# Patient Record
Sex: Male | Born: 1980
Health system: Southern US, Community
[De-identification: ages and names within clinical notes are randomized; demographics above are authoritative.]

## PROBLEM LIST (undated history)

## (undated) DIAGNOSIS — F419 Anxiety disorder, unspecified: Secondary | ICD-10-CM

## (undated) DIAGNOSIS — F4310 Post-traumatic stress disorder, unspecified: Secondary | ICD-10-CM

## (undated) DIAGNOSIS — F32A Depression, unspecified: Secondary | ICD-10-CM

## (undated) DIAGNOSIS — F209 Schizophrenia, unspecified: Secondary | ICD-10-CM

---

## 2017-10-29 ENCOUNTER — Encounter (HOSPITAL_BASED_OUTPATIENT_CLINIC_OR_DEPARTMENT_OTHER): Payer: Self-pay | Admitting: *Deleted

## 2017-10-29 ENCOUNTER — Other Ambulatory Visit: Payer: Self-pay

## 2017-10-29 ENCOUNTER — Emergency Department (HOSPITAL_BASED_OUTPATIENT_CLINIC_OR_DEPARTMENT_OTHER)
Admission: EM | Admit: 2017-10-29 | Discharge: 2017-10-29 | Disposition: A | Discharge status: Home / Self Care | Payer: Self-pay | Attending: Emergency Medicine | Admitting: Emergency Medicine

## 2017-10-29 DIAGNOSIS — F17200 Nicotine dependence, unspecified, uncomplicated: Secondary | ICD-10-CM | POA: Insufficient documentation

## 2017-10-29 DIAGNOSIS — L0231 Cutaneous abscess of buttock: Secondary | ICD-10-CM | POA: Insufficient documentation

## 2017-10-29 MED ORDER — IBUPROFEN 600 MG PO TABS: 600.0000 mg | ORAL_TABLET | Freq: Four times a day (QID) | ORAL | 0 refills | Status: AC | PRN

## 2017-10-29 MED ORDER — LIDOCAINE-EPINEPHRINE (PF) 2 %-1:200000 IJ SOLN
10.0000 mL | Freq: Once | INTRAMUSCULAR | Status: AC
  Administered 2017-10-29: 10 mL
  Filled 2017-10-29 (×2): qty 10

## 2017-10-29 MED ORDER — IBUPROFEN 400 MG PO TABS
600.0000 mg | ORAL_TABLET | Freq: Once | ORAL | Status: AC
  Administered 2017-10-29: 600 mg via ORAL
  Filled 2017-10-29: qty 1

## 2017-10-29 NOTE — ED Triage Notes (Signed)
He has an abscess on his buttocks. States he stuck a razor in it but it only drained a small amount.

## 2017-10-29 NOTE — Discharge Instructions (Signed)
Thank you for allowing me to care for you today in the Emergency Department.   Keep the area clean by washing the incision site once daily with warm water and soap.  You can apply a topical antibiotic such as bacitracin or Neosporin directly to the skin and then place a gauze dressing with tape on the area.  Make sure to change the dressing at least once daily or anytime that it gets dirty or soiled.  Make sure that you keep the area covered with a dressing until it stops draining.  Typically, these will drain for several days after the procedure that you had done today  Pull the packing material out in 48 hours.  There is a little tail and when he took illness, the rest of the material will come out.  Because of where the abscess is, it is important that you do not get any poop in the wound because this can cause a really bad infection.  You can help prevent this by keeping the area covered when you have to poop or have a bowel movement.  Take 600 mg of ibuprofen with food or 650 mg of Tylenol for pain control.  Apply a warm washcloth or compress to the area for 15 to 20 minutes 3 or 4 times a day to help with pain and swelling.  You should return to the emergency department if you develop fever, chills, or if the area gets significantly larger, hot to the touch, or red.

## 2017-10-29 NOTE — ED Notes (Signed)
ED Provider at bedside. 

## 2017-10-29 NOTE — ED Provider Notes (Signed)
[facility] EMERGENCY DEPARTMENT Provider Note   CSN: [id number] Arrival date & time: 10/29/17  1728     History   Chief Complaint Chief Complaint  Patient presents with  . Abscess    HPI Craig Richmond is a 37 y.o. male with no pertinent past medical history who presents to the emergency department with a chief complaint of abscess.  The patient reports a constant, rapidly worsening abscess to the left buttock that began 2 days ago.  He denies fever or chills.  He thinks that the area started after he was bitten by an insect.  He reports that he tried to treated at home by using a sterile razor blade to make an incision to see if it was drained.  He did not get any drainage out of that time.  No history of diabetes mellitus.  He is a current everyday smoker.  The history is provided by the patient. No language interpreter was used.    History reviewed. No pertinent past medical history.  There are no active problems to display for this patient.   History reviewed. No pertinent surgical history.      Home Medications    Prior to Admission medications   Medication Sig Start Date End Date Taking? Authorizing Provider  ibuprofen (ADVIL,MOTRIN) 600 MG tablet Take 1 tablet (600 mg total) by mouth every 6 (six) hours as needed. 10/29/17   Joshau Code A, PA-C    Family History No family history on file.  Social History Social History   Tobacco Use  . Smoking status: Current Every Day Smoker  . Smokeless tobacco: Never Used  Substance Use Topics  . Alcohol use: Not Currently  . Drug use: Not Currently     Allergies   Patient has no known allergies.   Review of Systems Review of Systems  Constitutional: Negative for activity change, chills and fever.  Respiratory: Negative for shortness of breath.   Cardiovascular: Negative for chest pain.  Gastrointestinal: Negative for abdominal pain.  Musculoskeletal: Negative for back pain.  Skin: Positive  for color change and wound. Negative for rash.     Physical Exam Updated Vital Signs BP 130/75 (BP Location: Left Arm)   Pulse 76   Temp 99.1 F (37.3 C) (Oral)   Resp 18   Ht 5' 8.5" (1.74 m)   Wt 95.3 kg   SpO2 99%   BMI 31.47 kg/m   Physical Exam  Constitutional: He appears well-developed.  HENT:  Head: Normocephalic.  Eyes: Conjunctivae are normal.  Neck: Neck supple.  Cardiovascular: Normal rate and regular rhythm.  No murmur heard. Pulmonary/Chest: Effort normal.  Abdominal: Soft. He exhibits no distension.  Neurological: He is alert.  Skin: Skin is warm and dry.  There is a 5 x 5 cm area of induration, fluctuance, erythema, and mild warmth to the left buttock.  No anal involvement.  No red streaking.  Psychiatric: His behavior is normal.  Nursing note and vitals reviewed.    ED Treatments / Results  Labs (all labs ordered are listed, but only abnormal results are displayed) Labs Reviewed  AEROBIC CULTURE (SUPERFICIAL SPECIMEN)    EKG None  Radiology No results found.  Procedures .Marland Kitchen.Incision and Drainage Date/Time: 10/29/2017 8:18 PM Performed by: Barkley BoardsMcDonald, Kayd Launer A, PA-C Authorized by: Barkley BoardsMcDonald, Huston Stonehocker A, PA-C   Consent:    Consent obtained:  Verbal   Consent given by:  Patient   Alternatives discussed:  No treatment Location:    Type:  Abscess  Size:  5x5 cm   Location: left buttock. Pre-procedure details:    Skin preparation:  Betadine Anesthesia (see MAR for exact dosages):    Anesthesia method:  Local infiltration   Local anesthetic:  Lidocaine 2% WITH epi Procedure type:    Complexity:  Simple Procedure details:    Needle aspiration: no     Incision types:  Single straight   Incision depth:  Dermal   Scalpel blade:  10   Wound management:  Probed and deloculated, irrigated with saline and extensive cleaning   Drainage:  Bloody and purulent   Drainage amount:  Moderate   Wound treatment:  Wound left open   Packing materials:  1/2 in  iodoform gauze Post-procedure details:    Patient tolerance of procedure:  Tolerated well, no immediate complications   (including critical care time)  Medications Ordered in ED Medications  lidocaine-EPINEPHrine (XYLOCAINE W/EPI) 2 %-1:200000 (PF) injection 10 mL (has no administration in time range)     Initial Impression / Assessment and Plan / ED Course  I have reviewed the triage vital signs and the nursing notes.  Pertinent labs & imaging results that were available during my care of the patient were reviewed by me and considered in my medical decision making (see chart for details).     Patient with skin abscess amenable to incision and drainage.  A moderate amount of purulent and bloody discharge was expressed.  Superficial wound culture sent.  The wound was packed with half-inch iodoform gauze.  Encouraged home warm soaks and flushing.  Mild signs of cellulitis is surrounding skin.  Will d/c to home.  No antibiotic therapy is indicated.  Return precautions to the ED given given the precarious location of the abscess.  He is hemodynamically stable and otherwise safe for discharge to home.  Final Clinical Impressions(s) / ED Diagnoses   Final diagnoses:  Left buttock abscess    ED Discharge Orders         Ordered    ibuprofen (ADVIL,MOTRIN) 600 MG tablet  Every 6 hours PRN     10/29/17 2014           Kamber Vignola, Coral Else, PA-C 10/29/17 2019    Vanetta Mulders, MD 11/04/17 [phone]

## 2017-11-01 LAB — AEROBIC CULTURE W GRAM STAIN (SUPERFICIAL SPECIMEN)

## 2017-11-01 LAB — AEROBIC CULTURE  (SUPERFICIAL SPECIMEN)

## 2017-11-02 ENCOUNTER — Telehealth: Payer: Self-pay | Admitting: *Deleted

## 2017-11-02 NOTE — Telephone Encounter (Signed)
Post ED Visit - Positive Culture Follow-up: Unsuccessful Patient Follow-up  Culture assessed and recommendations reviewed by:  []  Enzo BiNathan Batchelder, Pharm.D. []  Celedonio MiyamotoJeremy Frens, Pharm.D., BCPS AQ-ID []  Garvin FilaMike Maccia, Pharm.D., BCPS []  Georgina PillionElizabeth Martin, [address]Pharm.D., BCPS []  Allison ParkMinh Pham, Vermont[place]arm.D., BCPS, AAHIVP []  Estella HuskMichelle Turner, Pharm.D., BCPS, AAHIVP []  Sherlynn CarbonAustin Lucas, PharmD []  Pollyann SamplesAndy Johnston, PharmD, BCPS Jairo Benachel Rumberger, PharmD  Positive wound culture, reviewed by Harlene SaltsBrandon Morelli, PA-C  [x]  Patient discharged without antimicrobial prescription and treatment is now indicated []  Organism is resistant to prescribed ED discharge antimicrobial []  Patient with positive blood cultures  Plan: Doxycycline 100mg  PO BID x 7 days  Unable to contact patient after 3 attempts, letter will be sent to address on file  Lysle PearlRobertson, Bibiana Gillean Talley 11/02/2017, 10:27 AM

## 2017-11-02 NOTE — Progress Notes (Signed)
ED Antimicrobial Stewardship Positive Culture Follow Up   [name] is an 37 y.o. male who [facility] with a chief complaint of  Chief Complaint  Patient presents with  . Abscess    Recent Results (from the past 720 hour(s))  Wound or Superficial Culture     Status: None   Collection Time: 10/29/17  8:14 PM  Result Value Ref Range Status   Specimen Description   Final    BUTTOCKS Performed at [facility], [address] [place], [place] [address]    Special Requests   Final    Immunocompromised Performed at [facility], [address] [place], [place] [address]    Gram Stain   Final    FEW WBC PRESENT, PREDOMINANTLY PMN RARE GRAM POSITIVE COCCI Performed at [facility], [address] [place], [place] [address]    Culture   Final    MODERATE METHICILLIN RESISTANT STAPHYLOCOCCUS AUREUS   Report Status 11/01/2017 FINAL  Final   Organism ID, Bacteria METHICILLIN RESISTANT STAPHYLOCOCCUS AUREUS  Final      Susceptibility   Methicillin resistant staphylococcus aureus - MIC*    CIPROFLOXACIN >=8 RESISTANT Resistant     ERYTHROMYCIN >=8 RESISTANT Resistant     GENTAMICIN <=0.5 SENSITIVE Sensitive     OXACILLIN >=4 RESISTANT Resistant     TETRACYCLINE <=1 SENSITIVE Sensitive     VANCOMYCIN <=0.5 SENSITIVE Sensitive     TRIMETH/SULFA >=320 RESISTANT Resistant     CLINDAMYCIN >=8 RESISTANT Resistant     RIFAMPIN <=0.5 SENSITIVE Sensitive     Inducible Clindamycin NEGATIVE Sensitive     * MODERATE METHICILLIN RESISTANT STAPHYLOCOCCUS AUREUS    []  Treated with N/A, organism resistant to prescribed antimicrobial [x]  Patient discharged originally without antimicrobial agent and treatment is now indicated  New antibiotic prescription: doxycycline 100mg  PO BID x 7 days  ED Provider: Harlene SaltsBrandon Morelli, PA   Tibor Lemmons, Drake LeachRachel Lynn 11/02/2017, 9:26 AM Clinical Pharmacist Monday - Friday phone -  [phone] Saturday  - Sunday phone - [phone]

## 2018-01-07 ENCOUNTER — Telehealth: Payer: Self-pay | Admitting: Emergency Medicine

## 2018-01-07 NOTE — Telephone Encounter (Signed)
Lost to followup 

## 2019-01-20 ENCOUNTER — Emergency Department (HOSPITAL_COMMUNITY)
Admission: EM | Admit: 2019-01-20 | Discharge: 2019-01-20 | Disposition: A | Discharge status: Home / Self Care | Payer: Worker's Compensation | Attending: Emergency Medicine | Admitting: Emergency Medicine

## 2019-01-20 ENCOUNTER — Other Ambulatory Visit: Payer: Self-pay

## 2019-01-20 ENCOUNTER — Emergency Department (HOSPITAL_COMMUNITY): Payer: Self-pay | Attending: Emergency Medicine

## 2019-01-20 ENCOUNTER — Encounter (HOSPITAL_COMMUNITY): Payer: Self-pay | Admitting: Emergency Medicine

## 2019-01-20 DIAGNOSIS — Y990 Civilian activity done for income or pay: Secondary | ICD-10-CM | POA: Diagnosis not present

## 2019-01-20 DIAGNOSIS — S99911A Unspecified injury of right ankle, initial encounter: Secondary | ICD-10-CM | POA: Diagnosis present

## 2019-01-20 DIAGNOSIS — W228XXA Striking against or struck by other objects, initial encounter: Secondary | ICD-10-CM | POA: Diagnosis not present

## 2019-01-20 DIAGNOSIS — Y9389 Activity, other specified: Secondary | ICD-10-CM | POA: Diagnosis not present

## 2019-01-20 DIAGNOSIS — Y9289 Other specified places as the place of occurrence of the external cause: Secondary | ICD-10-CM | POA: Diagnosis not present

## 2019-01-20 DIAGNOSIS — F17200 Nicotine dependence, unspecified, uncomplicated: Secondary | ICD-10-CM | POA: Insufficient documentation

## 2019-01-20 DIAGNOSIS — R202 Paresthesia of skin: Secondary | ICD-10-CM | POA: Diagnosis not present

## 2019-01-20 DIAGNOSIS — S9001XA Contusion of right ankle, initial encounter: Secondary | ICD-10-CM | POA: Diagnosis not present

## 2019-01-20 MED ORDER — IBUPROFEN 400 MG PO TABS
600.0000 mg | ORAL_TABLET | Freq: Once | ORAL | Status: AC
  Administered 2019-01-20: 600 mg via ORAL
  Filled 2019-01-20: qty 1

## 2019-01-20 NOTE — ED Notes (Signed)
Patient transported to X-ray 

## 2019-01-20 NOTE — Discharge Instructions (Signed)
Use ice, ibuprofen, Tylenol for pain. Gradually return to full weightbearing. Follow-up with your work Librarian, academic for further management. Return for focal weakness, no improvement in 1 week or other concerns.

## 2019-01-20 NOTE — ED Notes (Signed)
Patient awake alert, color pink,chest clear,good aeration,no retractions 3plus pulses<2sec refill,patient ambulatory after tolerating po med
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
## 2019-07-10 ENCOUNTER — Other Ambulatory Visit: Payer: Self-pay

## 2019-07-10 ENCOUNTER — Encounter: Payer: Self-pay | Admitting: Internal Medicine

## 2019-07-10 ENCOUNTER — Ambulatory Visit (INDEPENDENT_AMBULATORY_CARE_PROVIDER_SITE_OTHER): Payer: Self-pay | Admitting: Internal Medicine

## 2019-07-10 DIAGNOSIS — F319 Bipolar disorder, unspecified: Secondary | ICD-10-CM | POA: Insufficient documentation

## 2019-07-10 DIAGNOSIS — R531 Weakness: Secondary | ICD-10-CM

## 2019-07-10 DIAGNOSIS — Z139 Encounter for screening, unspecified: Secondary | ICD-10-CM

## 2019-07-10 DIAGNOSIS — I10 Essential (primary) hypertension: Secondary | ICD-10-CM

## 2019-07-10 DIAGNOSIS — F1190 Opioid use, unspecified, uncomplicated: Secondary | ICD-10-CM

## 2019-07-10 DIAGNOSIS — F3132 Bipolar disorder, current episode depressed, moderate: Secondary | ICD-10-CM

## 2019-07-10 DIAGNOSIS — F1199 Opioid use, unspecified with unspecified opioid-induced disorder: Secondary | ICD-10-CM

## 2019-07-10 DIAGNOSIS — F329 Major depressive disorder, single episode, unspecified: Secondary | ICD-10-CM

## 2019-07-10 DIAGNOSIS — Z7689 Persons encountering health services in other specified circumstances: Secondary | ICD-10-CM
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
## 2019-07-10 NOTE — Patient Instructions (Addendum)
Mr. Stites,  It was a pleasure seeing you in clinic. Today we discussed:   Right sided weakness/numbness: I will obtain records from [facility] regarding your care with them. You may need further testing and evaluation for this.  Hypertension: I will continue to monitor you off of BP medications. We will get labs at your next visit.   Bipolar disorder: Please continue to take your medications as prescribed. Please follow up with [facility].   You will need to make an appointment with the financial counselor for your orange card. I will also send a referral to case management to assist with housing situation.    Please contact us if you have any questions or concerns.  Thank you!

## 2019-07-21 DIAGNOSIS — R531 Weakness: Secondary | ICD-10-CM | POA: Insufficient documentation

## 2019-07-21 NOTE — Assessment & Plan Note (Signed)
Patient with current cocaine (3.5g/day) and heroin use (1g/day). He also endorses taking oxycodone "as he can get it". He has a 30 month old son and notes that he is motivated to improve his life for his son.   Plan - Referral to OUD clinic  - Referral to CCM for resources

## 2019-07-21 NOTE — Assessment & Plan Note (Signed)
Patient reports being diagnosed with hypertension around the age of 71 while he was incarcerated and was started on antihypertensive at the time. He notes that he was taking this up until a few years ago.  Blood pressure 139/90 at this visit.  He does endorse ongoing cocaine use which could be contributing to his hypertension.  Patient does endorse motivation to discontinue cocaine use.  Patient would benefit from further work-up including renal ultrasound, renal and aldosterone levels, secondary causes of hypertension.  However, due to his uninsured status unable to obtain these at this time.   Plan -Encouraged cocaine cessation -BP check in 3 months -Consider starting low-dose HCTZ

## 2019-07-21 NOTE — Progress Notes (Signed)
Internal Medicine Clinic Attending  Case discussed with Dr. Aslam at the time of the visit.  We reviewed the resident's history and exam and pertinent patient test results.  I agree with the assessment, diagnosis, and plan of care documented in the resident's note.  

## 2019-07-21 NOTE — Assessment & Plan Note (Signed)
Patient has a history of recurrent incarcerations since the age of 16. He is currently unhomed and residing in a hotel with his significant other and 50-month-old spouse. He is currently uninsured as well. Patient notes ongoing cocaine and heroine use as listed below and does endorse willingness to quit. Suspect his cocaine use is also contributing to his hypertension as listed above. Patient would benefit from optimization of social determinants of health.   Plan: Referral to CCM

## 2019-07-21 NOTE — Assessment & Plan Note (Addendum)
Patient notes that he has a history of bipolar disorder.  He follows at [facility] for his medications.  He is on Depakote 500 mg daily and Zyprexa 10 mg daily.  He notes taking 1 other medication; however, is unable to recall the name at this time.  No recent manic episodes. However, PHQ-9 score 19 at this visit. Patient notes history of suicidal ideation but no attempts. No current suicidal ideation.   Plan Continue antipsychotics Follow-up with [facility]
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
## 2019-07-22 ENCOUNTER — Telehealth: Payer: Self-pay | Admitting: *Deleted

## 2019-07-22 NOTE — Telephone Encounter (Signed)
-----   Message from Tyson Alias, MD sent at 07/22/2019  1:29 PM EDT ----- Regarding: FW: OUD clinic Can we reach out to this patient to schedule him for OUD at next available please?  ----- Message ----- From: Gust Rung, DO Sent: 07/22/2019  11:31 AM EDT To: Tyson Alias, MD, Eliezer Bottom, MD Subject: RE: OUD clinic                                 Leanna Sato, we certainly can see about having him seen to start suboxone, it sounds like he likely will qualify.  Drs Felton Clinton, and Guilloud also see OUD patients and Dr Oswaldo Done is OUD attending next week. So I will add him to this tread.  We do not have a formal referral process, however if you think you have a patient for the OUD clinic, presenting them to one of the four of [place] if we are in clinic that day is great, otherwise you can send a group message to [place] by messaging P IMP OUD ATTENDING. -Dr Mikey Bussing ----- Message ----- From: Eliezer Bottom, MD Sent: 07/21/2019  11:36 AM EDT To: Gust Rung, DO Subject: OUD clinic                                     Good morning Dr. Mikey Bussing,  This patient would like to establish care with the opioid use clinic. He is currently using cocaine and heroine and also takes oxycodone frequently (usually off the streets). He does endorse willingness to change for his son. Would he be a good candidate for the opioid use clinic? If so, how can I place the referral?   Thank you, Leanna Sato

## 2019-07-22 NOTE — Telephone Encounter (Signed)
Pt scheduled for OUD Clinic 06/01/201 @ 9:45am (pt aware).Craig Spittle Cassady5/18/20211:52 PM

## 2019-07-23 ENCOUNTER — Ambulatory Visit: Payer: Self-pay | Admitting: *Deleted

## 2019-07-23 ENCOUNTER — Ambulatory Visit: Payer: Self-pay

## 2019-07-23 DIAGNOSIS — F1190 Opioid use, unspecified, uncomplicated: Secondary | ICD-10-CM

## 2019-07-23 DIAGNOSIS — I10 Essential (primary) hypertension: Secondary | ICD-10-CM

## 2019-07-23 DIAGNOSIS — F3132 Bipolar disorder, current episode depressed, moderate: Secondary | ICD-10-CM

## 2019-07-23 NOTE — Chronic Care Management (AMB) (Signed)
Chronic Care Management    Clinical Social Work General Note  07/23/2019 Name: Craig Richmond MRN: [id number] DOB: 21-Jun-1980  Craig Richmond is a 39 y.o. year old male who is a primary care patient of Marianna Payment, MD. The CCM was consulted to assist the patient with [employer] .   Mr. Mathews was given information about Chronic Care Management services today including:  1. CCM service includes personalized support from designated clinical staff supervised by his physician, including individualized plan of care and coordination with other care providers 2. 24/7 contact phone numbers for assistance for urgent and routine care needs. 3. Service will only be billed when office clinical staff spend 20 minutes or more in a month to coordinate care. 4. Only one practitioner may furnish and bill the service in a calendar month. 5. The patient may stop CCM services at any time (effective at the end of the month) by phone call to the office staff. 6. The patient will be responsible for cost sharing (co-pay) of up to 20% of the service fee (after annual deductible is met).  Patient agreed to services and verbal consent obtained.   Review of patient status, including review of consultants reports, relevant laboratory and other test results, and collaboration with appropriate care team members and the patient's provider was performed as part of comprehensive patient evaluation and provision of chronic care management services.    SDOH (Social Determinants of Health) assessments and interventions performed:  Yes SDOH Interventions     Most Recent Value  SDOH Interventions  SDOH Interventions for the Following Domains  Financial Strain, Food Insecurity, Housing  Food Insecurity Interventions  Assist with [employer] Application  Financial Strain Interventions  Other (Comment) [Referral to Disability Assistance Program and to Care Guide for assistance with Medicaid application]  Housing  Interventions  Other (Comment) [[address], [facility]]       Outpatient Encounter Medications as of 07/23/2019  Medication Sig  . divalproex (DEPAKOTE ER) 500 MG 24 hr tablet Take 500 mg by mouth daily.  Craig Richmond ibuprofen (ADVIL,MOTRIN) 600 MG tablet Take 1 tablet (600 mg total) by mouth every 6 (six) hours as needed.  Craig Richmond OLANZapine (ZYPREXA) 10 MG tablet Take 10 mg by mouth at bedtime.   No facility-administered encounter medications on file as of 07/23/2019.    Goals Addressed            This Visit's Progress   . "I don't have anywhere to live" (pt-stated)       Current Barriers:  Craig Richmond Knowledge Barriers related to resources available to address housing barriers.  Patient previously living in a motel with significant other and son but states today that this situation has changed and he has nowhere to live.  Patient was not forthcoming with specific details about this situation.   Case Manager Clinical Goal(s):  Craig Richmond Over the next 180 days, patient will work with BSW to address needs related to Housing barriers . Over the next 180 days, BSW will collaborate with RN Care Manager to address care management and care coordination needs  Interventions:  . Patient interviewed and appropriate assessments performed . Offered to assist patient with locating emergency shelter.  Patient declined this offer stating "I'm good with that." . Provided patient with number for [facility] and encouraged him to call to be assessed for housing program.  . Talked with patient about Craig [place] [address] and encouraged patient to call regarding housing assistance . Collaborated with RN Care  Manager and patient to establish an individualized plan of care   Patient Self Care Activities:  . Patient plans to pursue assistance through resources provided.   Initial goal documentation     . "I have no income right now" (pt-stated)       Current Barriers:  Craig Richmond Knowledge Barriers related to resources  available for financial constraints.  Patient currently unemployed with no source of income.  Has applied for disability but was denied; wishes to apply again.    Case Manager Clinical Goal(s):  Craig Richmond Over the next 180 days, patient will work with BSW to address needs related to financial constraints  . Over the next 180 days, BSW will collaborate with RN Care Manager to address care management and care coordination needs  Interventions:  . Patient interviewed and appropriate assessments performed . Collaborated with RN Care Manager and patient to establish an individualized plan of care  . Collaborated with care guide team regarding assistance with Medicaid and [place] SNAP applications . Informed patient about The Disability Assistance Program through [facility].  Patient provided address for consent form to be mailed to.  Consent form is necessary for referral to be submitted to program. . Reminded patient about upcoming clinic appointments including appointment with financial counselor on 08/11/19.    Patient Self Care Activities:  . Patient verbalizes understanding of plan to work with care guide for Medicaid and  SNAP applications.   . Patient verbalized understanding of plan to sign and return consent form for Disability Assistance Program.   Initial goal documentation         Follow Up Plan: Appointment scheduled for SW follow up with client by phone on: 07/31/19.  Patient provided with contact information for CCM SW and encouraged to call before next outreach if needed.          Ronn Melena, [place] Coordination Social Worker [place] [phone]

## 2019-07-23 NOTE — Progress Notes (Signed)
Internal Medicine Clinic Attending  CCM services provided by the care management provider and their documentation were discussed with Dr. Aslam. We reviewed the pertinent findings, urgent action items addressed by the resident and non-urgent items to be addressed by the PCP.  I agree with the assessment, diagnosis, and plan of care documented in the CCM and resident's note.  Shardea Cwynar, MD 07/23/2019 

## 2019-07-23 NOTE — Chronic Care Management (AMB) (Addendum)
Care Management   Initial Visit Note  07/23/2019 Name: Craig Richmond MRN: [id number] DOB: 02-26-1981     Objective:  BP Readings from Last 3 Encounters:  07/10/19 139/90  01/20/19 135/86  10/29/17 130/75   Wt Readings from Last 3 Encounters:  07/10/19 230 lb 3.2 oz (104.4 kg)  10/29/17 210 lb (95.3 kg)    Assessment: Craig Richmond is a 39 y.o. year old male who sees Dellia Cloud, MD for primary care. The care management team was consulted for assistance with care management and care coordination needs related to Care Coordination Substance Abuse Counseling Other homelessness and lack of income.   Review of patient status, including review of consultants reports, relevant laboratory and other test results, and collaboration with appropriate care team members and the patient's provider was performed as part of comprehensive patient evaluation and provision of care management services.    SDOH (Social Determinants of Health) assessments performed: No See Care Plan activities for detailed interventions related to [facility])     Outpatient Encounter Medications as of 07/23/2019  Medication Sig  . divalproex (DEPAKOTE ER) 500 MG 24 hr tablet Take 500 mg by mouth daily.  Marland Kitchen ibuprofen (ADVIL,MOTRIN) 600 MG tablet Take 1 tablet (600 mg total) by mouth every 6 (six) hours as needed.  Marland Kitchen OLANZapine (ZYPREXA) 10 MG tablet Take 10 mg by mouth at bedtime.   No facility-administered encounter medications on file as of 07/23/2019.    Goals Addressed            This Visit's Progress     Patient Stated   . "I told the social worker I don't have any place to live and no income." (pt-stated)       CARE PLAN ENTRY (see longitudinal plan of care for additional care plan information)   Current Barriers:  . Chronic Disease Management support, education, and care coordination needs related to HTN, Bipolar Disorder, and Homelessness  Case Manager Clinical Goal(s):  Marland Kitchen Over the next 90  days, patient will work with BSW to address needs related to Financial constraints related to no income . Limited access to food . Housing barriers . Substance abuse issues -  cocaine and heroin in patient with HTN, Bipolar Disorder, and Homelessness  Interventions:  . Collaborated with BSW to initiate plan of care to address needs related to Financial constraints related to no income . Limited access to food . Housing barriers . Substance abuse issues -  cocaine and heroin  in patient with HTN, Bipolar Disorder, and Homelessness  Patient Self Care Activities:  . Patient verbalizes understanding of plan to work with BSW and community care guide to address social barriers related to financial strain, economic strain, substance abuse and homelessness . Self administers medications as prescribed . Attends all scheduled provider appointments . Calls pharmacy for medication refills . Performs ADL's independently . Performs IADL's independently . Calls provider office for new concerns or questions  Initial goal documentation         Follow up plan:  The care management team will reach out to the patient again over the next 30 days.   [name] was given information about Care Management services today including:  1. Care Management services include personalized support from designated clinical staff supervised by a physician, including individualized plan of care and coordination with other care providers 2. 24/7 contact phone numbers for assistance for urgent and routine care needs. 3. The patient may stop Care Management services at any  time (effective at the end of the month) by phone call to the office staff.  Patient agreed to services and verbal consent obtained.  No CCM RN needs identified at this time.  Kelli Churn RN, CCM, [facility] RN Care Manager [phone]

## 2019-07-23 NOTE — Patient Instructions (Signed)
Visit Information  Goals Addressed            This Visit's Progress     Patient Stated   . "I told the social worker I don't have any place to live and no income." (pt-stated)       CARE PLAN ENTRY (see longitudinal plan of care for additional care plan information)   Current Barriers:  . Chronic Disease Management support, education, and care coordination needs related to HTN, Bipolar Disorder, and Homelessness  Case Manager Clinical Goal(s):  Marland Kitchen Over the next 90 days, patient will work with BSW to address needs related to Financial constraints related to no income . Limited access to food . Housing barriers . Substance abuse issues -  cocaine and heroin in patient with HTN, Bipolar Disorder, and Homelessness  Interventions:  . Collaborated with BSW to initiate plan of care to address needs related to Financial constraints related to no income . Limited access to food . Housing barriers . Substance abuse issues -  cocaine and heroin  in patient with HTN, Bipolar Disorder, and Homelessness  Patient Self Care Activities:  . Patient verbalizes understanding of plan to work with BSW and community care guide to address social barriers related to financial strain, economic strain, substance abuse and homelessness . Self administers medications as prescribed . Attends all scheduled provider appointments . Calls pharmacy for medication refills . Performs ADL's independently . Performs IADL's independently . Calls provider office for new concerns or questions  Initial goal documentation        Craig Richmond was given information about Care Management services today including:  1. Care Management services include personalized support from designated clinical staff supervised by his physician, including individualized plan of care and coordination with other care providers 2. 24/7 contact phone numbers for assistance for urgent and routine care needs. 3. The patient may stop CCM  services at any time (effective at the end of the month) by phone call to the office staff.  Patient agreed to services and verbal consent obtained.   The patient verbalized understanding of instructions provided today and declined a print copy of patient instruction materials.   The care management team will reach out to the patient again over the next 30 days.   Cranford Mon RN, CCM, [facility] CCM Clinic RN Care Manager [phone]

## 2019-07-23 NOTE — Progress Notes (Signed)
Internal Medicine Clinic Resident  I have personally reviewed this encounter including the documentation in this note and/or discussed this patient with the care management provider. I will address any urgent items identified by the care management provider and will communicate my actions to the patient's PCP. I have reviewed the patient's CCM visit with my supervising attending, Dr Butcher.  Seymour Pavlak, MD  Internal Medicine, PGY-1 07/23/2019    

## 2019-07-23 NOTE — Patient Instructions (Signed)
Visit Information  Goals Addressed            This Visit's Progress   . "I don't have anywhere to live" (pt-stated)       Current Barriers:  Marland Kitchen Knowledge Barriers related to resources available to address housing barriers.  Patient previously living in a motel with significant other and son but states today that this situation has changed and he has nowhere to live.  Patient was not forthcoming with specific details about this situation.   Case Manager Clinical Goal(s):  Marland Kitchen Over the next 180 days, patient will work with BSW to address needs related to Housing barriers . Over the next 180 days, BSW will collaborate with RN Care Manager to address care management and care coordination needs  Interventions:  . Patient interviewed and appropriate assessments performed . Offered to assist patient with locating emergency shelter.  Patient declined this offer stating "I'm good with that." . Provided patient with number for [facility] and encouraged him to call to be assessed for housing program.  . Talked with patient about [place] [address] and encouraged patient to call regarding housing assistance . Collaborated with RN Care Manager and patient to establish an individualized plan of care   Patient Self Care Activities:  . Patient plans to pursue assistance through resources provided.   Initial goal documentation     . "I have no income right now" (pt-stated)       Current Barriers:  Marland Kitchen Knowledge Barriers related to resources available for financial constraints.  Patient currently unemployed with no source of income.  Has applied for disability but was denied; wishes to apply again.    Case Manager Clinical Goal(s):  Marland Kitchen Over the next 180 days, patient will work with BSW to address needs related to financial constraints  . Over the next 180 days, BSW will collaborate with RN Care Manager to address care management and care coordination needs  Interventions:  . Patient interviewed  and appropriate assessments performed . Collaborated with RN Care Manager and patient to establish an individualized plan of care  . Collaborated with care guide team regarding assistance with Medicaid and [place] SNAP applications . Informed patient about The Disability Assistance Program through [facility].  Patient provided address for consent form to be mailed to.  Consent form is necessary for referral to be submitted to program. . Reminded patient about upcoming clinic appointments including appointment with financial counselor on 08/11/19.    Patient Self Care Activities:  . Patient verbalizes understanding of plan to work with care guide for Medicaid and [place] SNAP applications.   . Patient verbalized understanding of plan to sign and return consent form for Disability Assistance Program.   Initial goal documentation        Patient verbalizes understanding of instructions provided today.   Follow Up Plan: Appointment scheduled for SW follow up with client by phone on: 07/31/19.  Patient provided with contact information for CCM SW and encouraged to call before next outreach if needed.     Malachy Chamber, BSW Embedded Care Coordination Social Worker [facility] [phone]

## 2019-07-24 NOTE — Progress Notes (Signed)
Internal Medicine Clinic Attending  CCM services provided by the care management provider and their documentation were discussed with Dr. Aslam. We reviewed the pertinent findings, urgent action items addressed by the resident and non-urgent items to be addressed by the PCP.  I agree with the assessment, diagnosis, and plan of care documented in the CCM and resident's note.  Christo Hain Thomas Levette Paulick, MD 07/24/2019  

## 2019-07-24 NOTE — Progress Notes (Addendum)
Internal Medicine Clinic Resident  I have personally reviewed this encounter including the documentation in this note and/or discussed this patient with the care management provider. I will address any urgent items identified by the care management provider and will communicate my actions to the patient's PCP. I have reviewed the patient's CCM visit with my supervising attending, Dr Vincent.  Sadia Aslam, MD  Internal Medicine, PGY-1 07/24/2019    

## 2019-07-31 ENCOUNTER — Telehealth: Payer: Self-pay

## 2019-08-05 ENCOUNTER — Telehealth: Payer: Self-pay

## 2019-08-07 ENCOUNTER — Telehealth: Payer: Self-pay | Admitting: Internal Medicine

## 2019-08-07 ENCOUNTER — Telehealth: Payer: Self-pay

## 2019-08-07 NOTE — Telephone Encounter (Signed)
  Community Resource Referral   KNB 08/07/2019  DOB: 07-17-1980   AGE: 39 y.o.   GENDER: male   PCP Dellia Cloud, MD.   Called pt regarding Community Resource Referral pt asked for a c/b in a few minutes. Agreed to connect with [facility] DSS to apply for Medicaid and SNAP benefits. Manuela Schwartz  Care Guide . Embedded Care Coordination [facility] Management [email or web address]  [phone]

## 2019-08-11 ENCOUNTER — Ambulatory Visit: Payer: Self-pay | Admitting: Internal Medicine

## 2019-08-11 ENCOUNTER — Ambulatory Visit: Payer: Self-pay

## 2019-08-11 VITALS — BP 140/87 | HR 72 | Temp 98.4°F | Ht 68.0 in | Wt 233.3 lb

## 2019-08-11 DIAGNOSIS — I10 Essential (primary) hypertension: Secondary | ICD-10-CM

## 2019-08-11 DIAGNOSIS — R531 Weakness: Secondary | ICD-10-CM

## 2019-08-11 MED ORDER — AMLODIPINE BESYLATE 5 MG PO TABS: 2.5000 mg | ORAL_TABLET | Freq: Every day | ORAL | 0 refills | Status: AC

## 2019-08-11 NOTE — Telephone Encounter (Signed)
  Community Resource Referral   KNB 08/11/2019  2nd Attempt - vm box not set up  DOB: 1980/12/19   AGE: 39 y.o.   GENDER: male   PCP Dellia Cloud, MD.   Called pt regarding Community Resource Referral Follow up on: 08/12/2019 Manuela Schwartz  Care Guide . Embedded Care Coordination [facility] Management [email or web address]  [phone]

## 2019-08-11 NOTE — Patient Instructions (Addendum)
It was our pleasure taking care of you in our clinic today.  Start you on a blood pressure medication. (Amlodipine 2.5 mg daily) please pick it up from pharmacy and take it as instructed.  Please take rest of your medications as before.  Please bring your document to start the process of getting insurance (orange card) coverage as a scheduled at 08/13/2019 with our financial counselor.  Follow-up with your primary care in 1-2 months to establish care. We will need to have your medical record from your prior clinic.  Please bring it with you next time. You have your insurance coverage, we will do some blood work and probably imaging for your right arm and foot pain that you have been deal with since your injury several month ago.  Should you have any questions or concerns please call the internal medicine clinic at [phone].

## 2019-08-11 NOTE — Progress Notes (Signed)
promed   CC: Follow-up of HTN and right side pain and numbness  HPI:  [name] is a 39 y.o. male with PMHx as documented below, presented for follow-up of right upper and lower extremity pain and hypertension.  Please refer to problem based charting for further details and assessment and plan of current problem and chronic medical conditions.   PMHx: HTN, Bipolar disorder, OUD, rt sided weakness 2/2 to injury  Review of Systems:  Review of Systems  Constitutional: Negative for chills and fever.  Gastrointestinal: Negative for nausea and vomiting.  Genitourinary:       No urinary incontinency.  Neurological: Positive for sensory change and weakness.   Physical Exam:  Vitals:   08/11/19 1349  BP: 140/87  Pulse: 72  Temp: 98.4 F (36.9 C)  TempSrc: Oral  SpO2: 98%  Weight: 233 lb 4.8 oz (105.8 kg)  Height: 5\' 8"  (1.727 m)   Constitutional: Well-developed and well-nourished. No acute distress.  HENT:  Head: Normocephalic and atraumatic.  Eyes: Conjunctivae are normal, EOM nl Cardiovascular: RRR, nl S1S2, no murmur, no LEE Respiratory: Effort normal and breath sounds normal. No respiratory distress. No wheezes.  GI: Soft. Bowel sounds are normal. No distension. There is no tenderness.  Neurological: Is alert and oriented x 3, rt upper and lower extremity with decreased sensation and weakness 3-4/5. Skin: Not diaphoretic. No erythema.  Psychiatric:  Normal mood and affect. Behavior is normal. Judgment and thought content normal.   Assessment & Plan:   See Encounters Tab for problem based charting.  Patient discussed with Dr. 

## 2019-08-12 NOTE — Assessment & Plan Note (Signed)
Patient has had consistent residual weakness/decreased sensation of rt upper and lower extremities after had injury at work on 01/2019. This is a chronic problem. Unclear how much work up performed but per patient, no MRI was done by [employer] comp.  -Med rec release request signed by pt to be faxed to [facility] -When gets the orange card, may consider EMG/NCV vs spinal MRI and neuro/neurosurgery referral if needed -Continue PT -F/u with financial counselor for orange card (pt has appointment in 2 days)

## 2019-08-12 NOTE — Assessment & Plan Note (Signed)
BP today is 140/87. The patient's blood pressure during this visit was: BP Readings from Last 3 Encounters:  08/11/19 140/87  07/10/19 139/90  01/20/19 135/86   -He is interested in starting medications. He mentions that he was on antihypertensive medications before and when incarcerated. -Started Amlodipine 2.5 mg QD (No insurance coverage and wont need frequent BMP check on Amlodipine) -Counseled about importance of stopping Cocaine use

## 2019-08-12 NOTE — Telephone Encounter (Signed)
  Community Resource Referral   KNB 08/12/2019  1st Attempt  DOB: 1980-11-28   AGE: 39 y.o.   GENDER: male   PCP Dellia Cloud, MD.   Called pt regarding Community Resource Referral - pt stated that he had applied for Medicaid in the past but wasn't certain of the date. He discussed his appt tomorrow with financial counselor on 6/9. 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 Asked him if he could speak with DSS to apply for Medicaid as that would be the fastest approach and 3-wayd the call to apply for Medicaid and for Food Stamps.  Manuela Schwartz  Care Guide . Embedded Care Coordination [facility] Management [email or web address]  [phone]

## 2019-08-13 ENCOUNTER — Ambulatory Visit: Payer: Self-pay

## 2019-08-18 NOTE — Progress Notes (Signed)
Internal Medicine Clinic Attending  Case discussed with Dr. Masoudi  at the time of the visit.  We reviewed the resident's history and exam and pertinent patient test results.  I agree with the assessment, diagnosis, and plan of care documented in the resident's note.  

## 2019-08-22 ENCOUNTER — Ambulatory Visit: Payer: Self-pay

## 2019-08-22 ENCOUNTER — Telehealth: Payer: Self-pay

## 2019-08-22 NOTE — Chronic Care Management (AMB) (Signed)
  Chronic Care Management   Outreach Note  08/22/2019 Name: Craig Richmond MRN: [id number] DOB: 1980-09-12  Referred by: Dellia Cloud, MD Reason for referral : Care Coordination [facility])   An unsuccessful telephone outreach was attempted today. The patient was referred to the case management team for assistance with care management and care coordination. Unable to leave message due to mailbox not being set up.    Follow Up Plan: Will try to reach again next week.        Malachy Chamber, BSW Embedded Care Coordination Social Worker [facility] [phone]

## 2019-08-29 ENCOUNTER — Ambulatory Visit: Payer: Self-pay

## 2019-08-29 ENCOUNTER — Telehealth: Payer: Self-pay

## 2019-08-29 NOTE — Chronic Care Management (AMB) (Signed)
  Chronic Care Management   Outreach Note  08/29/2019 Name: Craig Richmond MRN: [id number] DOB: 08/24/1980  Referred by: Dellia Cloud, MD Reason for referral : Care Coordination [facility])   A second unsuccessful telephone outreach was attempted today. The patient was referred to the case management team for assistance with care management and care coordination. Mailbox not set up yet.    Follow Up Plan: Will try to reach again next week.       Malachy Chamber, BSW Embedded Care Coordination Social Worker [facility] [phone]

## 2019-09-04 ENCOUNTER — Telehealth: Payer: Self-pay

## 2019-09-04 ENCOUNTER — Ambulatory Visit: Payer: Self-pay

## 2019-09-04 NOTE — Chronic Care Management (AMB) (Signed)
  Chronic Care Management   Outreach Note  09/04/2019 Name: Craig Richmond MRN: [id number] DOB: 1980-10-12  Referred by: Dellia Cloud, MD Reason for referral : Care Coordination [facility])   Third unsuccessful telephone outreach was attempted today. Unable to leave message due to mailbox not being set up. The patient was referred to the case management team for assistance with care management and care coordination. The patient's primary care provider has been notified of our unsuccessful attempts to make or maintain contact with the patient. The care management team is pleased to engage with this patient at any time in the future should he/she be interested in assistance from the care management team.   Follow Up Plan: The care management team is available to follow up with the patient after provider conversation with the patient regarding recommendation for care management engagement and subsequent re-referral to the care management team.       Malachy Chamber, BSW Embedded Care Coordination Social Worker [facility] [phone]

## 2019-11-19 MED FILL — AMLODIPINE BESYLATE 5 MG TA: 5 | 30 days supply | Qty: 15 | Fill #1

## 2019-12-09 ENCOUNTER — Ambulatory Visit: Payer: Self-pay

## 2019-12-09 NOTE — Chronic Care Management (AMB) (Signed)
CCM status updated to previously enrolled due to inability to maintain contact with pt.      Malachy Chamber, BSW Embedded Care Coordination Social Worker [facility] [phone]

## 2020-01-27 ENCOUNTER — Emergency Department (HOSPITAL_COMMUNITY)
Admission: EM | Admit: 2020-01-27 | Discharge: 2020-01-27 | Disposition: A | Discharge status: Home / Self Care | Payer: Self-pay | Attending: Emergency Medicine | Admitting: Emergency Medicine

## 2020-01-27 ENCOUNTER — Other Ambulatory Visit: Payer: Self-pay

## 2020-01-27 ENCOUNTER — Encounter (HOSPITAL_COMMUNITY): Payer: Self-pay

## 2020-01-27 DIAGNOSIS — Z87891 Personal history of nicotine dependence: Secondary | ICD-10-CM | POA: Insufficient documentation

## 2020-01-27 DIAGNOSIS — Z79899 Other long term (current) drug therapy: Secondary | ICD-10-CM | POA: Insufficient documentation

## 2020-01-27 DIAGNOSIS — Z09 Encounter for follow-up examination after completed treatment for conditions other than malignant neoplasm: Secondary | ICD-10-CM

## 2020-01-27 DIAGNOSIS — R441 Visual hallucinations: Secondary | ICD-10-CM | POA: Insufficient documentation

## 2020-01-27 DIAGNOSIS — Z046 Encounter for general psychiatric examination, requested by authority: Secondary | ICD-10-CM | POA: Insufficient documentation

## 2020-01-27 DIAGNOSIS — R440 Auditory hallucinations: Secondary | ICD-10-CM | POA: Insufficient documentation

## 2020-01-27 DIAGNOSIS — I10 Essential (primary) hypertension: Secondary | ICD-10-CM | POA: Insufficient documentation

## 2020-01-27 DIAGNOSIS — Z8659 Personal history of other mental and behavioral disorders: Secondary | ICD-10-CM

## 2020-01-27 HISTORY — DX: Post-traumatic stress disorder, unspecified: F43.10

## 2020-01-27 HISTORY — DX: Anxiety disorder, unspecified: F41.9

## 2020-01-27 HISTORY — DX: Depression, unspecified: F32.A

## 2020-01-27 HISTORY — DX: Schizophrenia, unspecified: F20.9

## 2020-01-27 LAB — CBC
HCT: 45.1 % (ref 39.0–52.0)
Hemoglobin: 14.5 g/dL (ref 13.0–17.0)
MCH: 26.7 pg (ref 26.0–34.0)
MCHC: 32.2 g/dL (ref 30.0–36.0)
MCV: 82.9 fL (ref 80.0–100.0)
Platelets: 240 10*3/uL (ref 150–400)
RBC: 5.44 MIL/uL (ref 4.22–5.81)
RDW: 13.1 % (ref 11.5–15.5)
WBC: 3.6 10*3/uL — ABNORMAL LOW (ref 4.0–10.5)
nRBC: 0 % (ref 0.0–0.2)

## 2020-01-27 LAB — SALICYLATE LEVEL: Salicylate Lvl: <7 mg/dL — ABNORMAL LOW (ref 7.0–30.0)

## 2020-01-27 LAB — ACETAMINOPHEN LEVEL: Acetaminophen (Tylenol), Serum: <10 ug/mL — ABNORMAL LOW (ref 10–30)

## 2020-01-27 LAB — COMPREHENSIVE METABOLIC PANEL
ALT: 26 U/L (ref 0–44)
AST: 25 U/L (ref 15–41)
Albumin: 4.1 g/dL (ref 3.5–5.0)
Alkaline Phosphatase: 82 U/L (ref 38–126)
Anion gap: 10 (ref 5–15)
BUN: 10 mg/dL (ref 6–20)
CO2: 25 mmol/L (ref 22–32)
Calcium: 8.9 mg/dL (ref 8.9–10.3)
Chloride: 104 mmol/L (ref 98–111)
Creatinine, Ser: 1.14 mg/dL (ref 0.61–1.24)
GFR, Estimated: >60 mL/min (ref >60)
Glucose, Bld: 104 mg/dL — ABNORMAL HIGH (ref 70–99)
Potassium: 4 mmol/L (ref 3.5–5.1)
Sodium: 139 mmol/L (ref 135–145)
Total Bilirubin: 0.7 mg/dL (ref 0.3–1.2)
Total Protein: 7.5 g/dL (ref 6.5–8.1)

## 2020-01-27 LAB — ETHANOL: Alcohol, Ethyl (B): <10 mg/dL (ref <10)

## 2020-01-27 NOTE — ED Provider Notes (Signed)
[facility] EMERGENCY DEPARTMENT Provider Note   CSN: [id number] Arrival date & time: 01/27/20  1049     History Chief Complaint  Patient presents with  . Psychiatric Evaluation    Craig Richmond is a 39 y.o. male with a past medical history of schizophrenia, PTSD, anxiety and depression presenting to the ED requesting mental health evaluation.  States that he has been off of his psych he had tried medications for several months.  He used to follow-up at [facility] but has not done so.  He does not remember the name of his medications.  Reports auditory and visual hallucinations at times.  Denies any SI, HI or other complaints.  HPI     Past Medical History:  Diagnosis Date  . Anxiety   . Depression   . PTSD (post-traumatic stress disorder)   . Schizophrenia [facility])     Patient Active Problem List   Diagnosis Date Noted  . Right sided weakness 07/21/2019  . Hypertension 07/10/2019  . Bipolar disorder (HCC) 07/10/2019  . Encounter for screening involving social determinants of health (SDoH) 07/10/2019  . Opioid use disorder 07/10/2019    History reviewed. No pertinent surgical history.     No family history on file.  Social History   Tobacco Use  . Smoking status: Former Smoker    Quit date: 03/07/2010    Years since quitting: 9.8  . Smokeless tobacco: Never Used  . Tobacco comment: stopped in 2012   Substance Use Topics  . Alcohol use: Not Currently  . Drug use: Not Currently    Home Medications Prior to Admission medications   Medication Sig Start Date End Date Taking? Authorizing Provider  amLODipine (NORVASC) 5 MG tablet Take 0.5 tablets (2.5 mg total) by mouth daily. 08/11/19   Masoudi, Shawna Orleans, MD  divalproex (DEPAKOTE ER) 500 MG 24 hr tablet Take 500 mg by mouth daily.    [provider]  ibuprofen (ADVIL,MOTRIN) 600 MG tablet Take 1 tablet (600 mg total) by mouth every 6 (six) hours as needed. 10/29/17   McDonald, Mia A,  PA-C  OLANZapine (ZYPREXA) 10 MG tablet Take 10 mg by mouth at bedtime.    [provider]    Allergies    Bactrim [sulfamethoxazole-trimethoprim]  Review of Systems   Review of Systems  Constitutional: Negative for appetite change, chills and fever.  HENT: Negative for ear pain, rhinorrhea, sneezing and sore throat.   Eyes: Negative for photophobia and visual disturbance.  Respiratory: Negative for cough, chest tightness, shortness of breath and wheezing.   Cardiovascular: Negative for chest pain and palpitations.  Gastrointestinal: Negative for abdominal pain, blood in stool, constipation, diarrhea, nausea and vomiting.  Genitourinary: Negative for dysuria, hematuria and urgency.  Musculoskeletal: Negative for myalgias.  Skin: Negative for rash.  Neurological: Negative for dizziness, weakness and light-headedness.  Psychiatric/Behavioral: Positive for hallucinations.    Physical Exam Updated Vital Signs BP (!) 148/102 (BP Location: Right Arm)   Pulse 67   Temp 98.2 F (36.8 C) (Oral)   Resp 20   Ht 5\' 8"  (1.727 m)   Wt 97.5 kg   SpO2 98%   BMI 32.69 kg/m   Physical Exam Vitals and nursing note reviewed.  Constitutional:      General: He is not in acute distress.    Appearance: He is well-developed.  HENT:     Head: Normocephalic and atraumatic.     Nose: Nose normal.  Eyes:     General: No  scleral icterus.       Left eye: No discharge.     Conjunctiva/sclera: Conjunctivae normal.  Cardiovascular:     Rate and Rhythm: Normal rate and regular rhythm.     Heart sounds: Normal heart sounds. No murmur heard.  No friction rub. No gallop.   Pulmonary:     Effort: Pulmonary effort is normal. No respiratory distress.     Breath sounds: Normal breath sounds.  Abdominal:     General: Bowel sounds are normal. There is no distension.     Palpations: Abdomen is soft.     Tenderness: There is no abdominal tenderness. There is no guarding.  Musculoskeletal:         General: Normal range of motion.     Cervical back: Normal range of motion and neck supple.  Skin:    General: Skin is warm and dry.     Findings: No rash.  Neurological:     Mental Status: He is alert.     Motor: No abnormal muscle tone.     Coordination: Coordination normal.     ED Results / Procedures / Treatments   Labs (all labs ordered are listed, but only abnormal results are displayed) Labs Reviewed  COMPREHENSIVE METABOLIC PANEL - Abnormal; Notable for the following components:      Result Value   Glucose, Bld 104 (*)    All other components within normal limits  SALICYLATE LEVEL - Abnormal; Notable for the following components:   Salicylate Lvl <7.0 (*)    All other components within normal limits  ACETAMINOPHEN LEVEL - Abnormal; Notable for the following components:   Acetaminophen (Tylenol), Serum <10 (*)    All other components within normal limits  CBC - Abnormal; Notable for the following components:   WBC 3.6 (*)    All other components within normal limits  ETHANOL  RAPID URINE DRUG SCREEN, HOSP PERFORMED    EKG None  Radiology No results found.  Procedures Procedures (including critical care time)  Medications Ordered in ED Medications - No data to display  ED Course  I have reviewed the triage vital signs and the nursing notes.  Pertinent labs & imaging results that were available during my care of the patient were reviewed by me and considered in my medical decision making (see chart for details).    MDM Rules/Calculators/A&P                          39 year old male presenting to the ED requesting mental health evaluation.  Was on psychiatric medications in the past but has not taken them in several months and would like to get back on them.  He reports auditory and visual hallucinations that the medications prior improved.  He denies any SI, HI.  His thought content is normal, he appears stable here.  He is not hallucinating on my  evaluation.  He has no plans to harm himself or anyone else.  He is hemodynamically stable.  I spoke to [facility] provider [place] money regarding follow up at [facility].  Patient states that he is a resident of [facility] and I have had registration correct this in the system.  He was told to follow-up at the Center tomorrow morning as a walk-in.  He is stable here and is comfortable with this plan.  His medical screening lab work is unremarkable.  He is medically cleared for follow up there.   Patient is hemodynamically stable, in  NAD, and able to ambulate in the ED. Evaluation does not show pathology that would require ongoing emergent intervention or inpatient treatment. I explained the diagnosis to the patient. Pain has been managed and has no complaints prior to discharge. Patient is comfortable with above plan and is stable for discharge at this time. All questions were answered prior to disposition. Strict return precautions for returning to the ED were discussed. Encouraged follow up with PCP.   An After Visit Summary was printed and given to the patient.   Portions of this note were generated with Scientist, clinical (histocompatibility and immunogenetics). Dictation errors may occur despite best attempts at proofreading.  Final Clinical Impression(s) / ED Diagnoses Final diagnoses:  Psychiatric follow-up    Rx / DC Orders ED Discharge Orders    None       Dietrich Pates, PA-C 01/27/20 1311    Arby Barrette, MD 01/27/20 1343

## 2020-01-27 NOTE — Discharge Instructions (Addendum)
You can follow-up at the clinic listed below tomorrow morning  [facility] [address] Walk-in appointment time Monday - Thursday 8-11 am, except this Thursday bc of Thanksgiving

## 2020-01-27 NOTE — ED Triage Notes (Signed)
Pt requesting mental health evaluation. Hx of psych problems but has been out of his meds for a while. Denies SI/HI at this time, does report AH and VH. Pt calm and cooperative in triage.

## 2020-01-27 NOTE — ED Notes (Signed)
Pt refused last set of VS. Says he wants everything to discarded and that he better not see a bill.

## 2021-02-02 IMAGING — CR DG ANKLE COMPLETE 3+V*R*
3 series · 3 of 3 positions shown · non-contrast
Comparison: None.

CLINICAL DATA: Posttraumatic right ankle pain

EXAM:
RIGHT ANKLE - COMPLETE 3+ VIEW

[ankle ap]
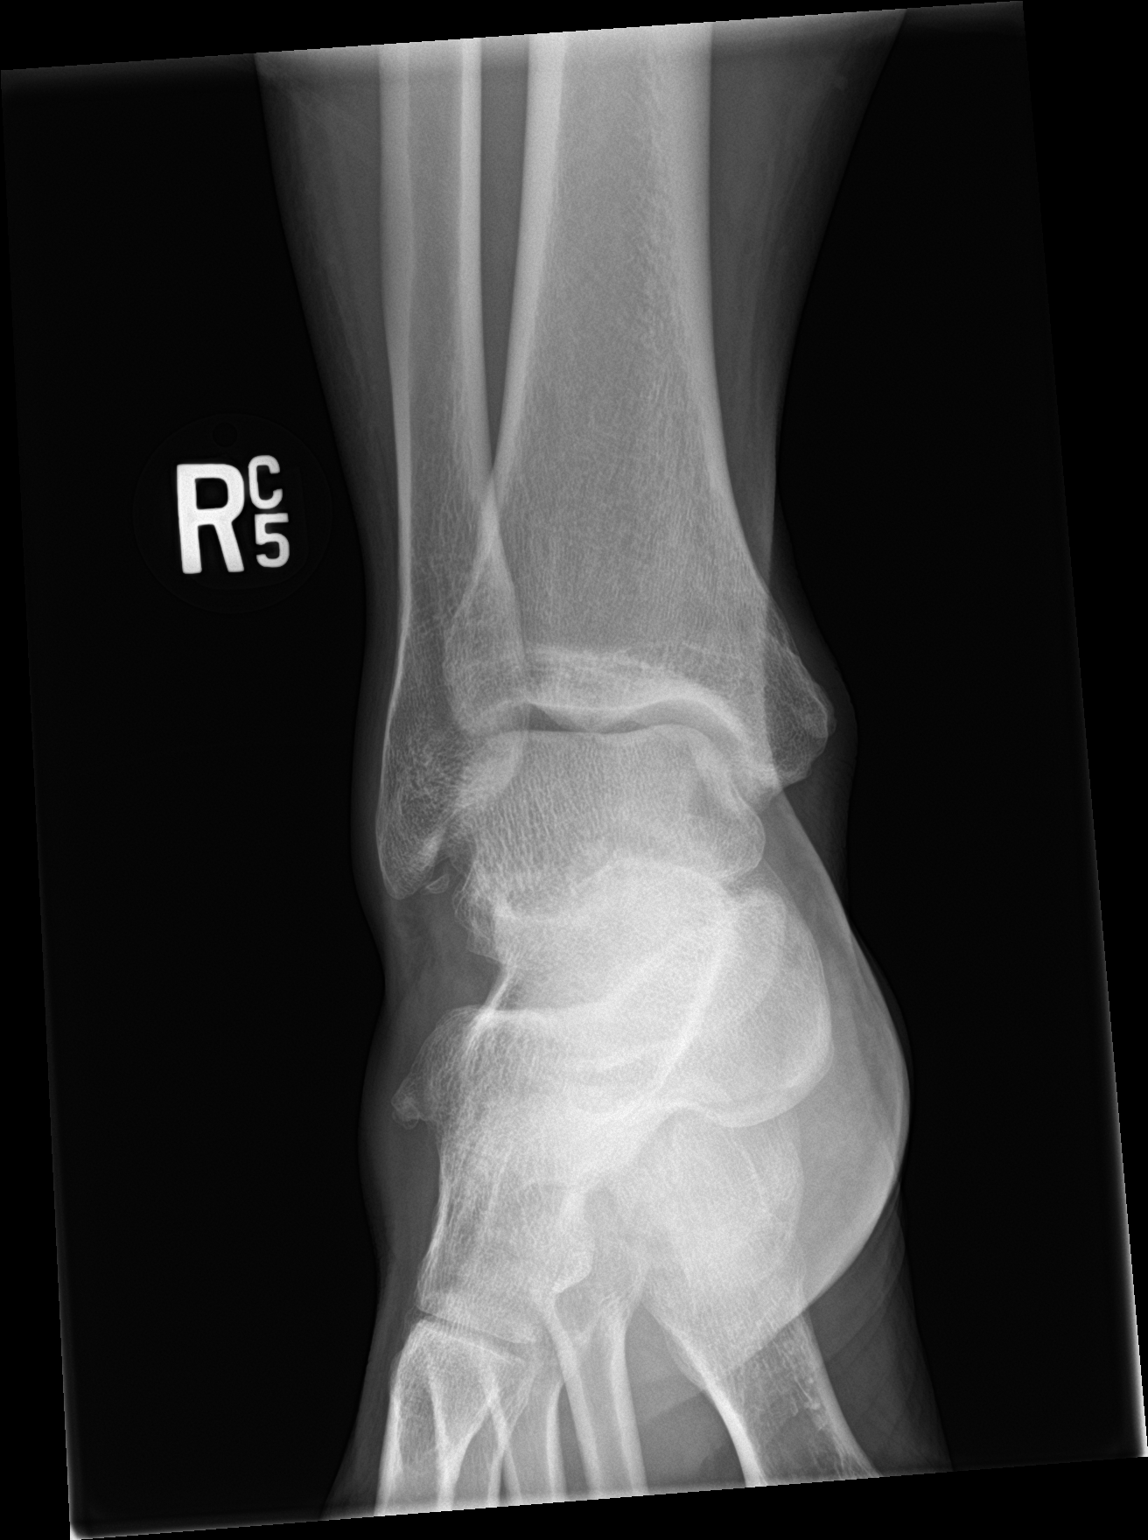

[ankle obl]
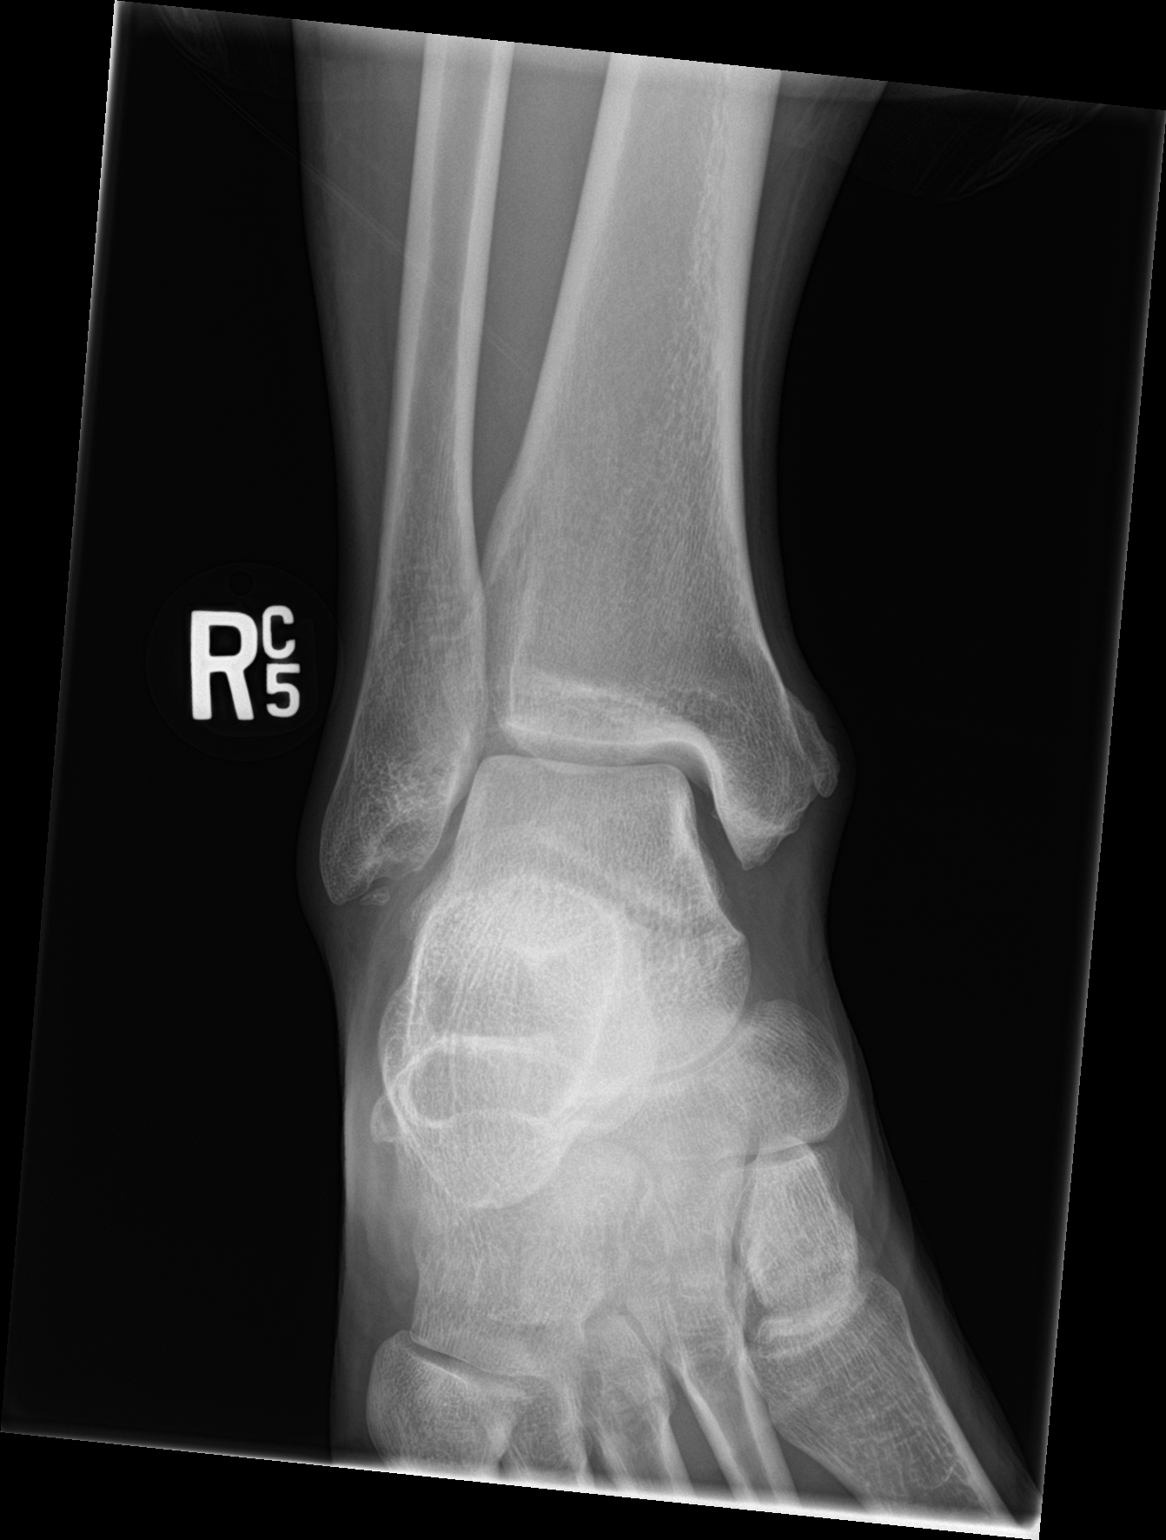

[ankle lat]
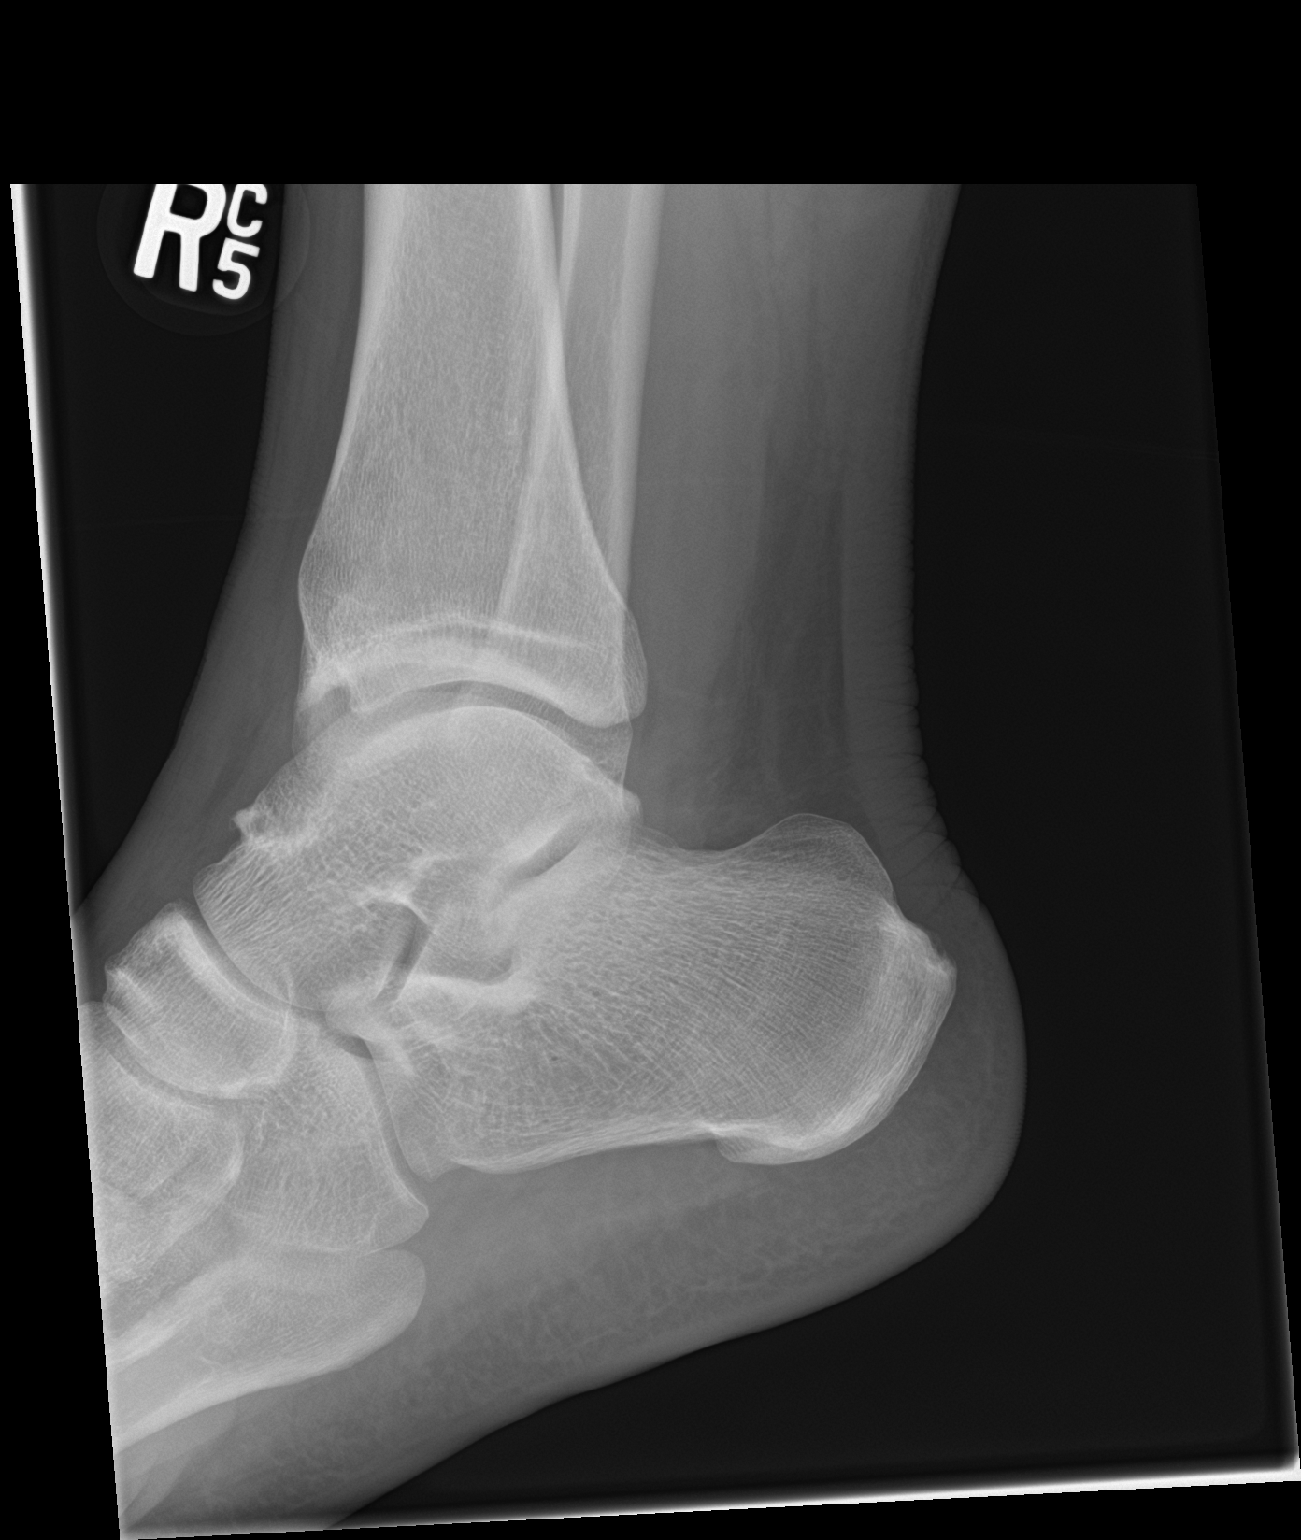

[3 of 3 positions shown; findings below may reference images not displayed]

FINDINGS: There is no evidence of fracture, dislocation, or joint effusion.
Sub fibular ossicle and medial malleolus enthesophyte.
IMPRESSION: Negative for fracture.
